# Patient Record
Sex: Female | Born: 1995 | Race: Black or African American | Hispanic: No | Marital: Single | Smoking: Never smoker
Health system: Southern US, Community
[De-identification: ages and names within clinical notes are randomized; demographics above are authoritative.]

## PROBLEM LIST (undated history)

## (undated) DIAGNOSIS — Z789 Other specified health status: Secondary | ICD-10-CM

## (undated) DIAGNOSIS — A749 Chlamydial infection, unspecified: Secondary | ICD-10-CM

## (undated) DIAGNOSIS — N739 Female pelvic inflammatory disease, unspecified: Secondary | ICD-10-CM

## (undated) HISTORY — PX: HAND SURGERY: SHX662

## (undated) HISTORY — PX: NO PAST SURGERIES: SHX2092

---
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
## 2017-01-03 ENCOUNTER — Telehealth: Payer: Self-pay | Admitting: *Deleted

## 2017-01-03 NOTE — Telephone Encounter (Signed)
I called [facility] and phone rang many times but no one answered, nor voicemail- unable to leave a message. Per chart review letter was sent- need to discuss with patient.

## 2017-01-03 NOTE — Telephone Encounter (Signed)
Pt left message on 1/6 stating that she would like her test results.

## 2017-01-04 NOTE — Telephone Encounter (Signed)
Per chart review, patient has chlamydia & we haven't been able to reach her with results. Called patient at number multiple times but no answer- phone keeps ringing. Also tried emergency contact number

## 2017-01-06 ENCOUNTER — Encounter: Payer: Self-pay | Admitting: *Deleted

## 2017-01-06 NOTE — Telephone Encounter (Signed)
Final attempt to reach pt by phone - no answer and no voice mail to leave message. Per chart review, unable to find previously mentioned letter to pt, therefore it was drafted and will be sent today. Letter will also include information regarding +UTI and need for treatment with antibiotic. Communicable Disease Report has been sent to [place] [facility] dept per note from Tresa Endoarole Michalski, RN on 12/29/16.

## 2017-03-10 IMAGING — US US OB TRANSVAGINAL
1 series · 15 of 28 positions shown · non-contrast
Comparison: Ultrasound dated 06/18/2016

ADDENDUM:
Please note with a positive HCG level and in the absence of
documented intrauterine pregnancy, the possibility of an ectopic
pregnancy is not entirely excluded. Correlation with serial HCG
levels and follow-up with ultrasound recommended.
CLINICAL DATA: 19-year-old female with abdominal and pelvic pain

EXAM:
TRANSVAGINAL OB ULTRASOUND
TECHNIQUE: Transvaginal ultrasound was performed for complete evaluation of the
gestation as well as the maternal uterus, adnexal regions, and
pelvic cul-de-sac.

[Series 1: us ob transvaginal · 15 of 30 slices shown]
[im 1/30]
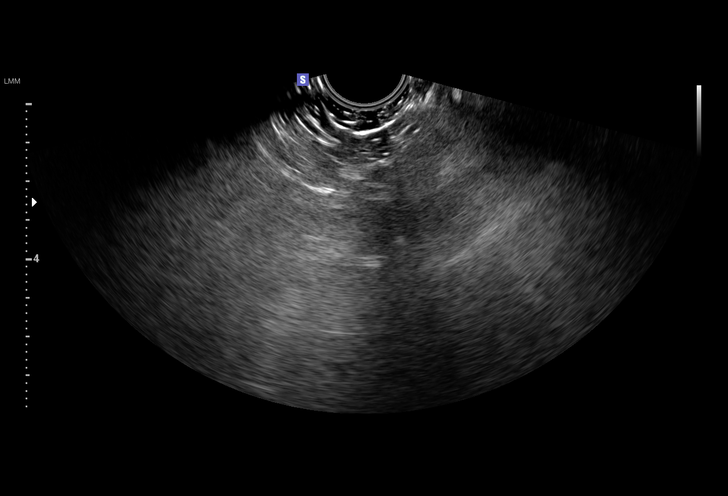
[im 3/30]
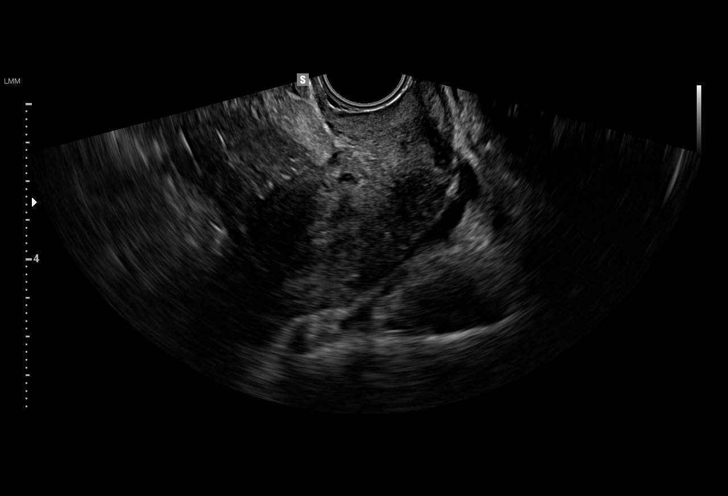
[im 5/30]
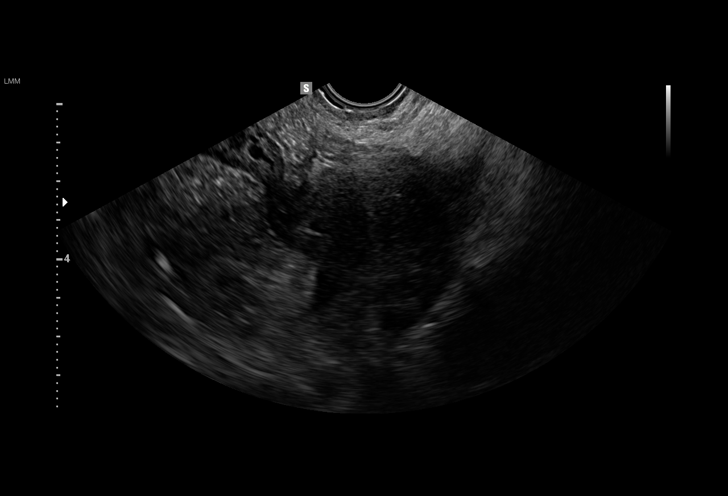
[im 7/30]
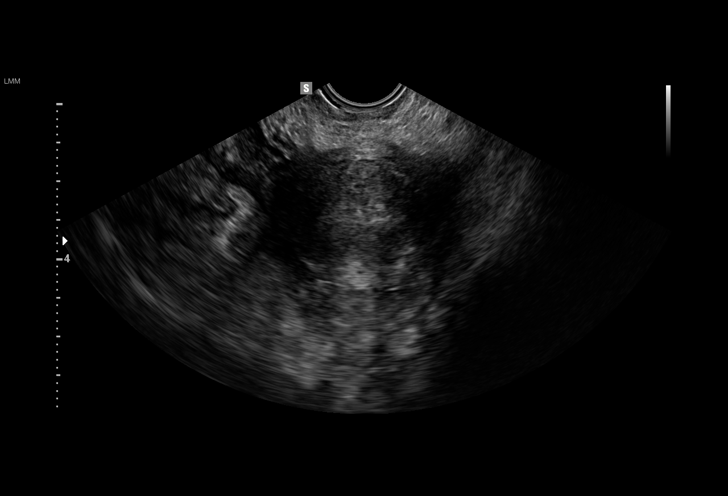
[im 9/30]
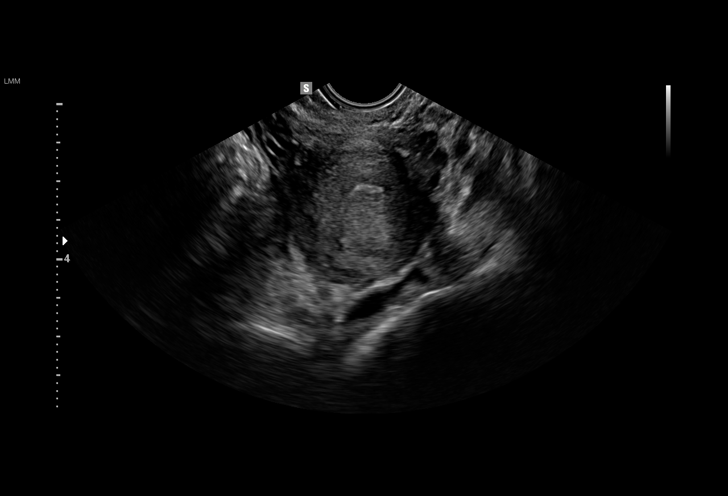
[im 11/30]
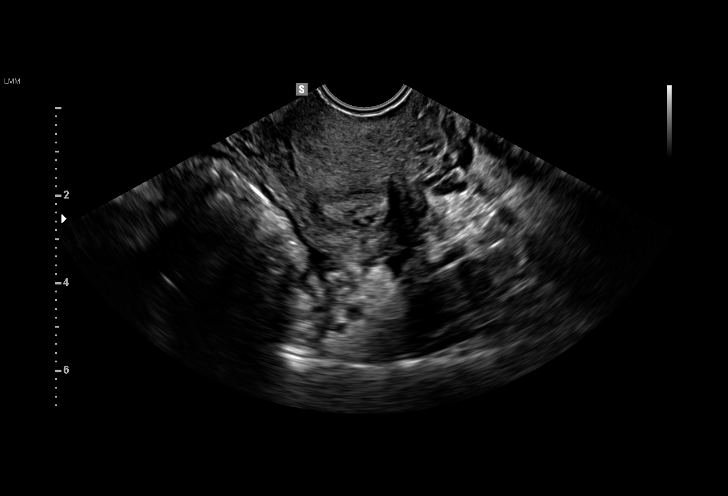
[im 13/30]
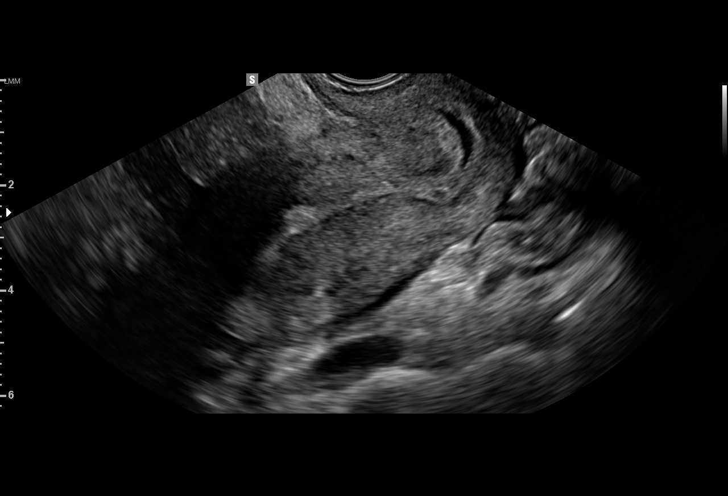
[im 16/30]
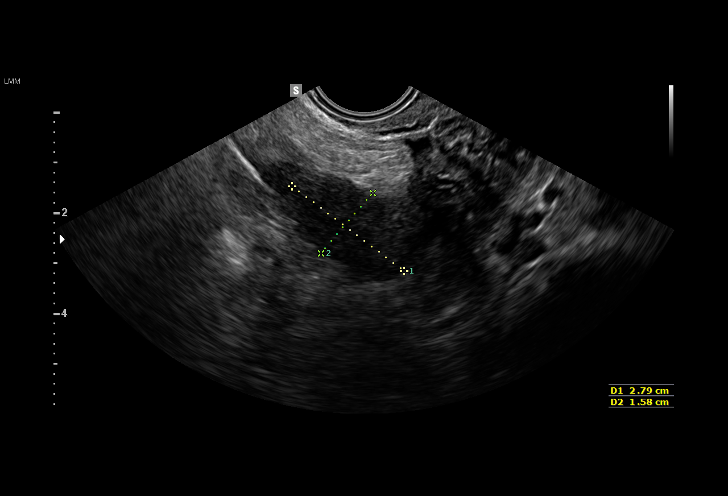
[im 17/30]
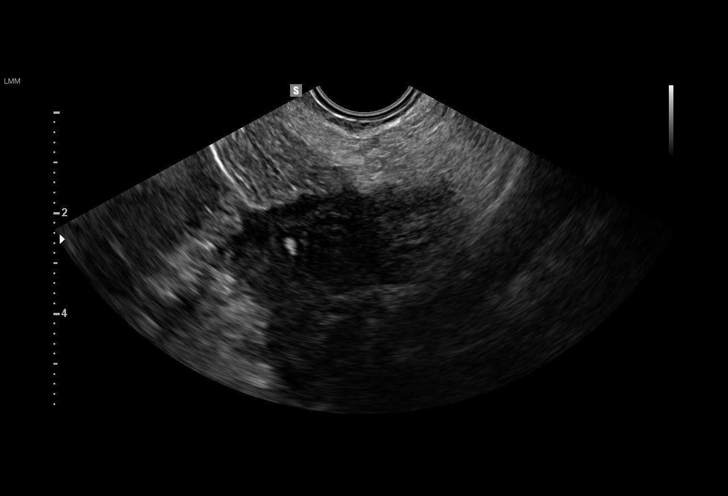
[im 19/30]
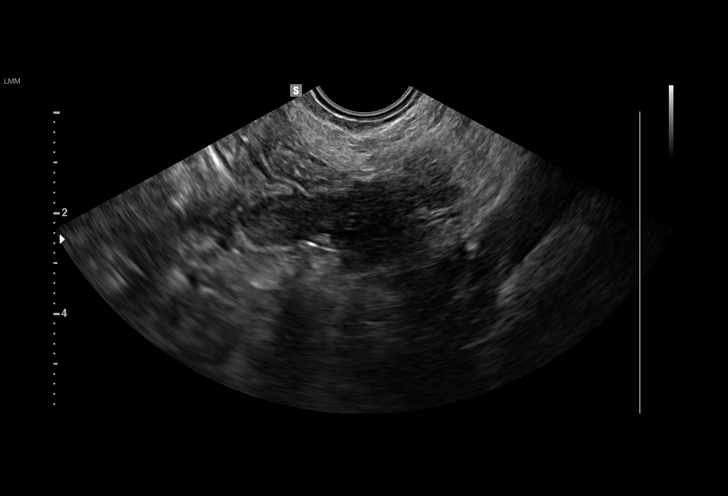
[im 21/30]
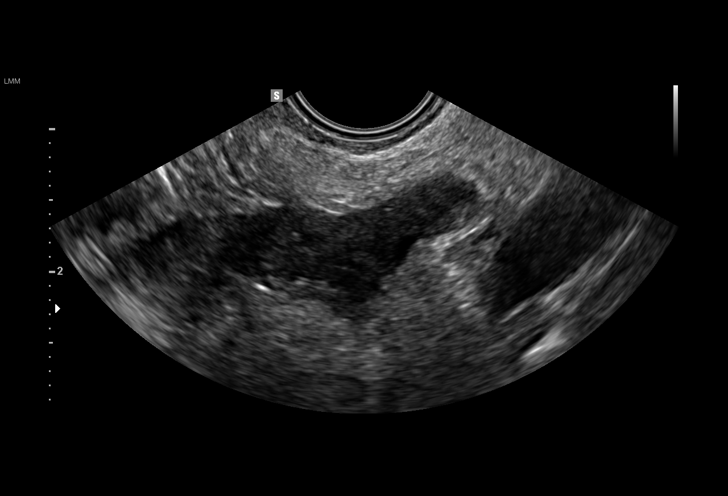
[im 23/30]
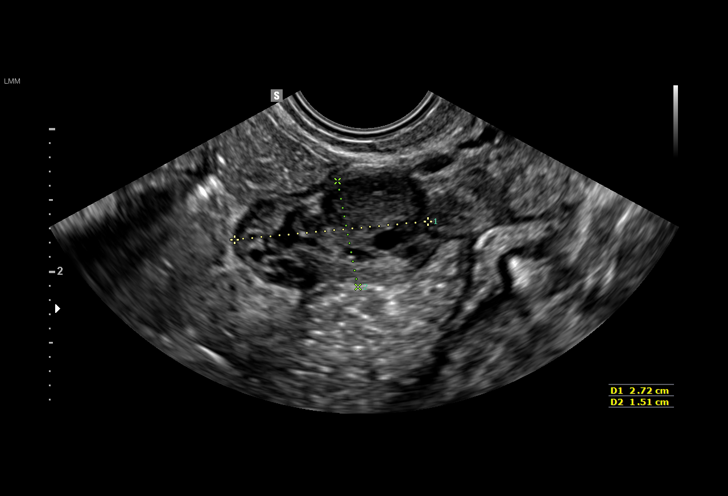
[im 25/30]
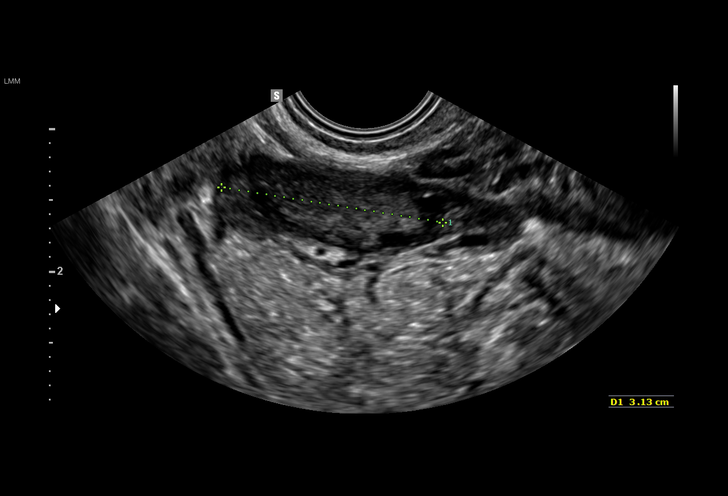
[im 27/30]
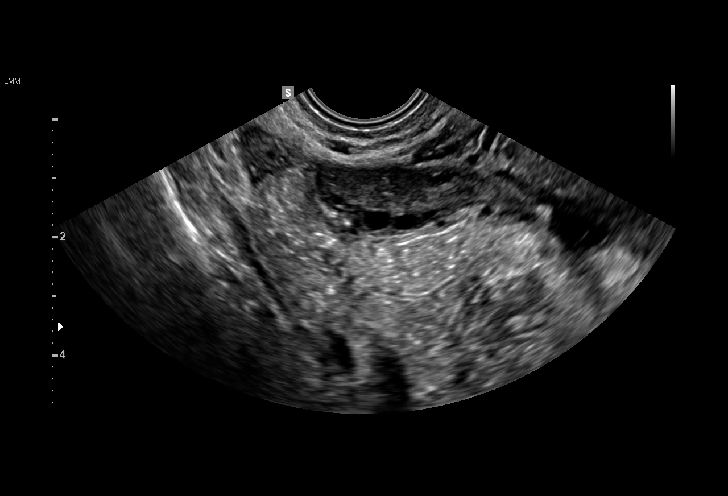
[im 30/30]
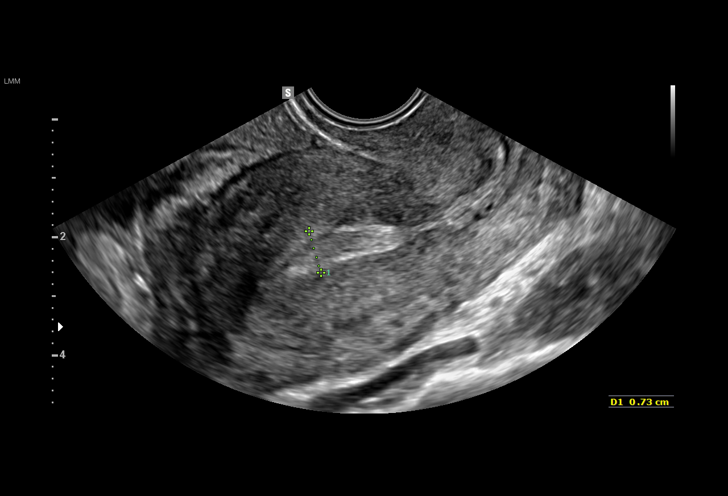

[15 of 28 positions shown; findings below may reference images not displayed]

FINDINGS: The uterus is anteverted and appears unremarkable. The endometrium
measures 7 mm in thickness. No intrauterine pregnancy identified.

The ovaries appear unremarkable. The right ovary measures 2.7 x
x 3.1 cm. There is a probable corpus luteum in the right ovary. The
left ovary measures 2.8 x 3.0 x 1.6 cm.

No free fluid noted within the pelvis.
IMPRESSION: Unremarkable uterus and the ovaries. No intrauterine pregnancy
identified.

## 2017-03-11 IMAGING — CT CT ABD-PELV W/ CM
1 of 2 series · 14 of 32 positions shown, 18 images · IV contrast (OMNIPAQUE)
Comparison: Pelvic ultrasound dated 06/19/2016

ADDENDUM:
These results were called by telephone at the time of interpretation
on 06/20/2016 at [DATE] to Dr. HEGOI RF , who verbally acknowledged
these results.
CLINICAL DATA: 19-year-old female with right lower quadrant
abdominal pain. Active miscarriage.

EXAM:
CT ABDOMEN AND PELVIS WITH CONTRAST
TECHNIQUE: Multidetector CT imaging of the abdomen and pelvis was performed
using the standard protocol following bolus administration of
intravenous contrast.
CONTRAST:  100mL [id number] IOPAMIDOL ([id number]) INJECTION 61%

[Series 2: routine abdomen/pelvis with · axial · 0.59mm/px · z∈[-436,-71]mm · 14 of 84 slices shown, 18 images]
[im 7/84  soft-tissue]
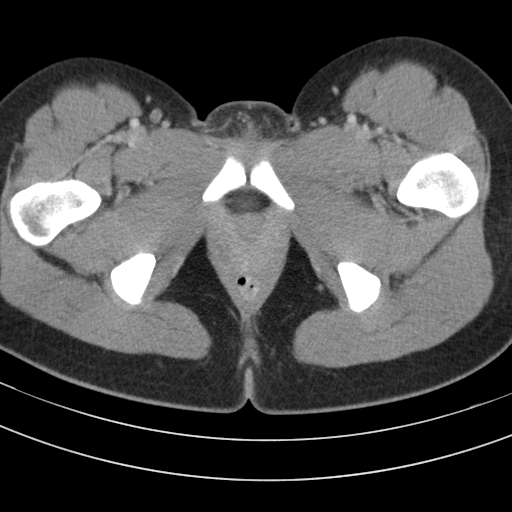
[im 7/84  bone]
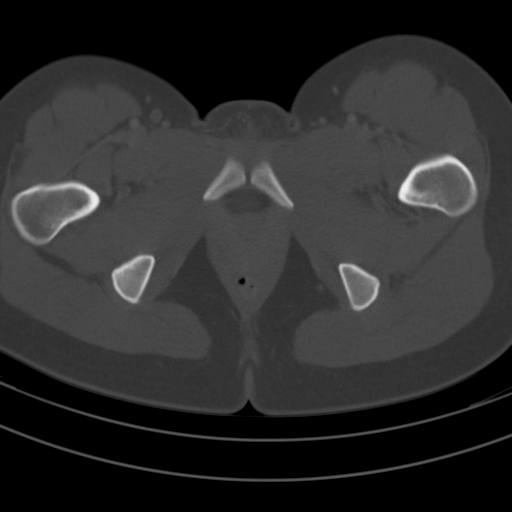
[im 13/84  soft-tissue]
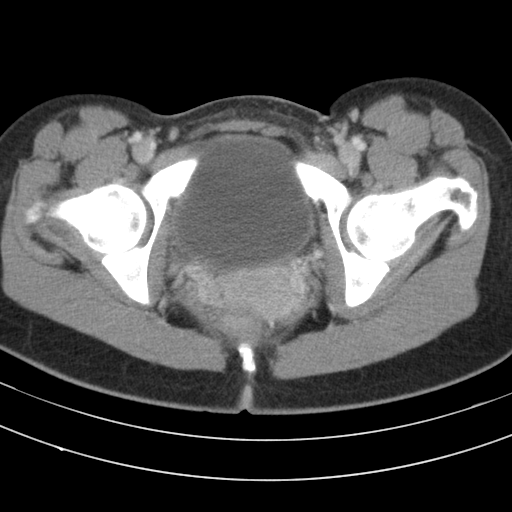
[im 20/84  soft-tissue]
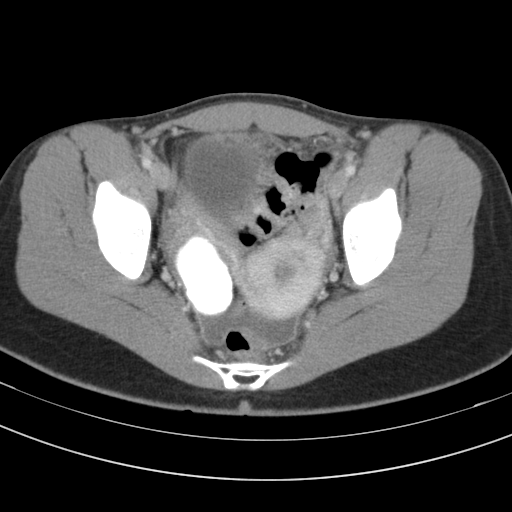
[im 26/84  soft-tissue]
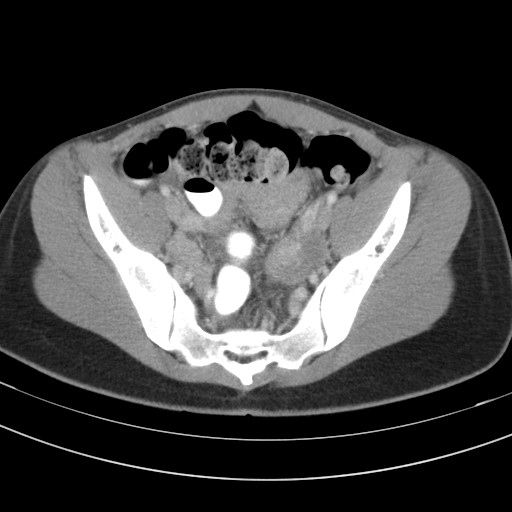
[im 32/84  soft-tissue]
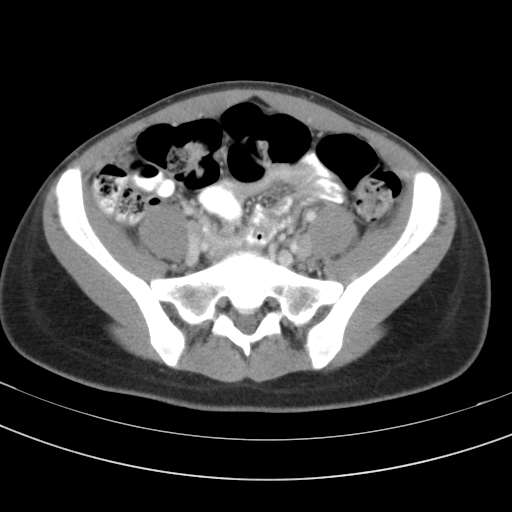
[im 39/84  soft-tissue]
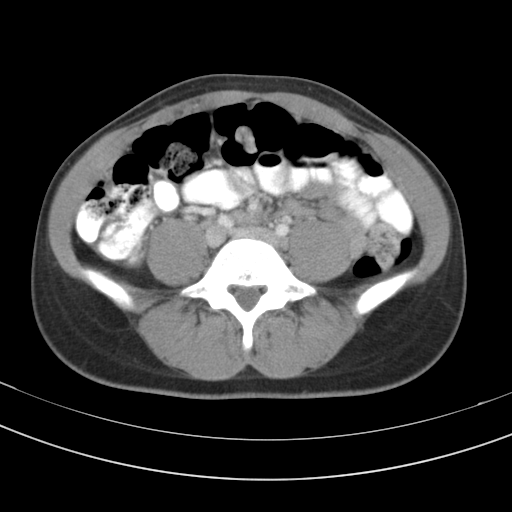
[im 45/84  soft-tissue]
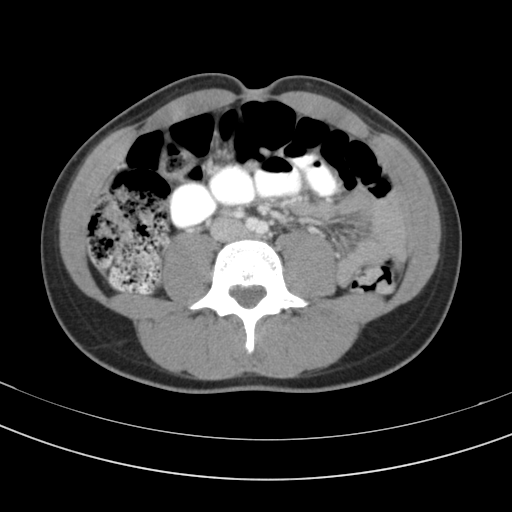
[im 52/84  soft-tissue]
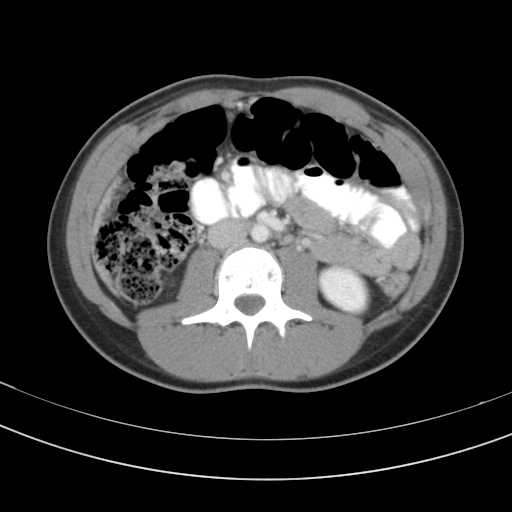
[im 58/84  soft-tissue]
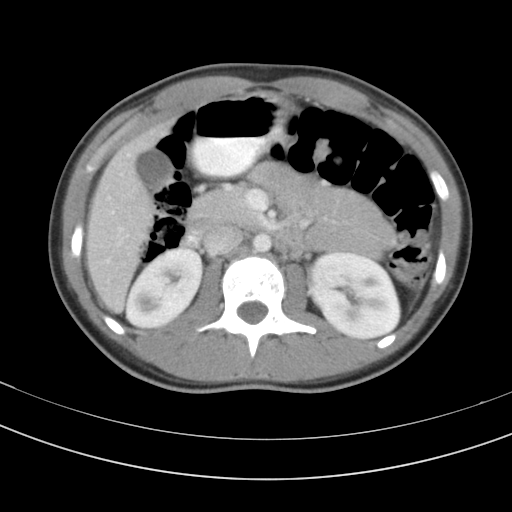
[im 58/84  bone]
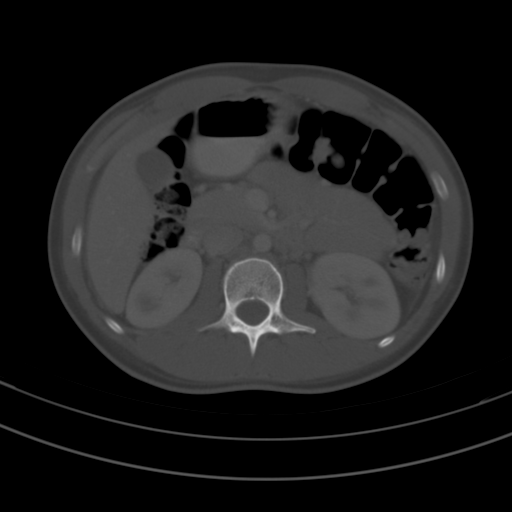
[im 64/84  soft-tissue]
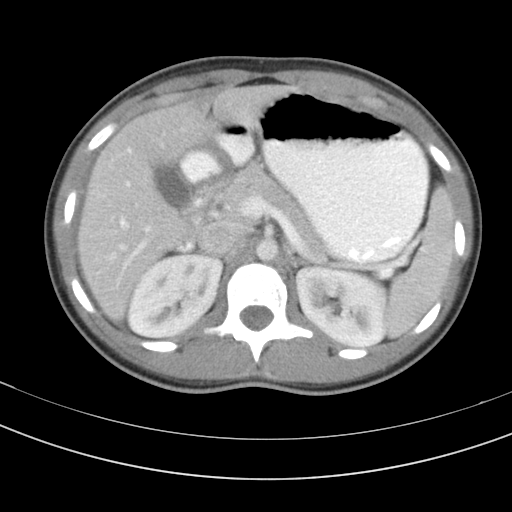
[im 71/84  soft-tissue]
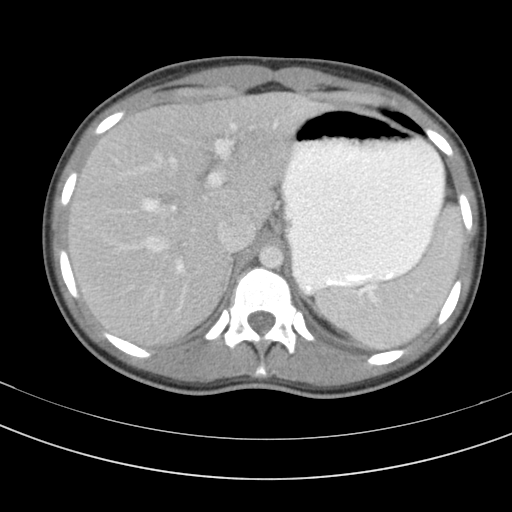
[im 71/84  lung]
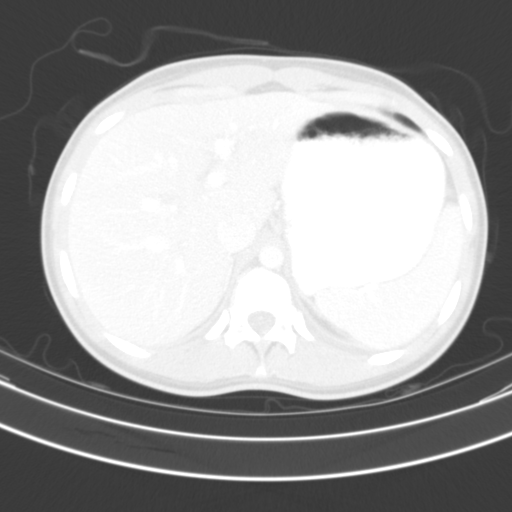
[im 74/84  lung]
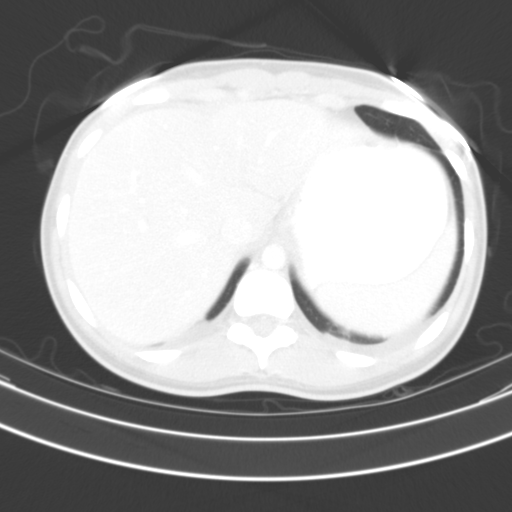
[im 77/84  soft-tissue]
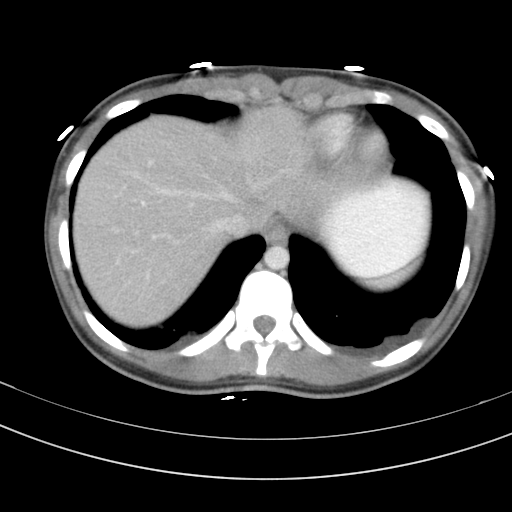
[im 77/84  lung]
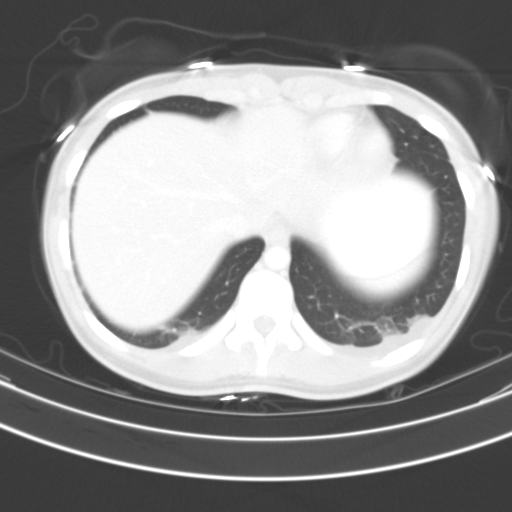
[im 80/84  lung]
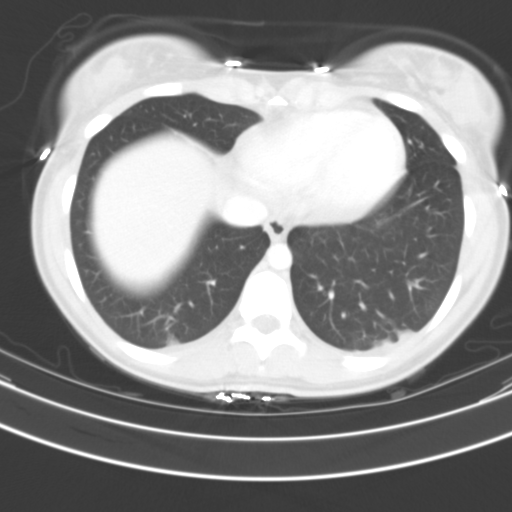

[14 of 32 positions shown; findings below may reference images not displayed]

FINDINGS: Partially visualized small bilateral pleural effusions. The
visualized lung bases are clear. No intra-abdominal free air. There
is a small amount of loculated appearing fluid within the posterior
pelvis extending along the right posterior lateral pelvic wall
concerning for an abscess. This measures approximately 3.3 x 8.7 cm
in greatest axial dimension.

The liver, gallbladder, pancreas, spleen, adrenal glands, kidneys,
visualized ureters, and urinary bladder appear unremarkable. The
uterus and ovaries are grossly unremarkable.

There is thickened appearance of the rectosigmoid with apparent
inflammatory changes of htis segment of bowel. Evaluation however is
limited due to surrounding fluid and non opacification of this
segment of bowel with oral contrast. Multiple tiny pockets of gas
superior to the sigmoid colon (coronal images 73, 76 and sagittal
images [REDACTED] be intraluminal air within a collapsed colonic
fold or represent focally contained microperforations. The thickened
and inflamed appearance of the sigmoid may be reactive to
surrounding infected fluid or represent colitis. Diverticulitis is
less likely given patient's age. Correlation with clinical exam and
history of underlying inflammatory bowel disease recommended. There
is moderate stool throughout the remainder of the colon. There is no
evidence of small-bowel obstruction. Normal appendix.

The abdominal aorta and IVC appear unremarkable. No portal venous
gas identified. There is no adenopathy. The abdominal wall soft
tissues appear unremarkable. The osseous structures are intact.
IMPRESSION: Small loculated fluid in the posterior pelvis with enhancing walls
most compatible with an infected fluid or an abscess. There is
thickened and mildly inflamed appearance of the sigmoid colon which
may be reactive to surrounding infected fluid or represent colitis.
Correlation with clinical exam and history of inflammatory bowel
disease recommended. Multiple small pockets of gas superior to the
sigmoid colon may be intraluminal air within a collapsed colonic
fold or represent focally contained microperforation.

No bowel obstruction.  Normal appendix.

## 2021-09-07 ENCOUNTER — Emergency Department (HOSPITAL_COMMUNITY)
Admission: EM | Admit: 2021-09-07 | Discharge: 2021-09-07 | Disposition: A | Discharge status: Home / Self Care | Payer: Medicaid - Out of State | Attending: Emergency Medicine | Admitting: Emergency Medicine

## 2021-09-07 ENCOUNTER — Encounter (HOSPITAL_COMMUNITY): Payer: Self-pay | Admitting: *Deleted

## 2021-09-07 ENCOUNTER — Other Ambulatory Visit: Payer: Self-pay

## 2021-09-07 DIAGNOSIS — Z5321 Procedure and treatment not carried out due to patient leaving prior to being seen by health care provider: Secondary | ICD-10-CM | POA: Insufficient documentation

## 2021-09-07 DIAGNOSIS — W07XXXA Fall from chair, initial encounter: Secondary | ICD-10-CM | POA: Insufficient documentation

## 2021-09-07 DIAGNOSIS — R0781 Pleurodynia: Secondary | ICD-10-CM | POA: Insufficient documentation

## 2021-09-07 DIAGNOSIS — Z3A00 Weeks of gestation of pregnancy not specified: Secondary | ICD-10-CM | POA: Diagnosis not present

## 2021-09-07 DIAGNOSIS — O26899 Other specified pregnancy related conditions, unspecified trimester: Secondary | ICD-10-CM | POA: Diagnosis not present

## 2021-09-07 LAB — CBC WITH DIFFERENTIAL/PLATELET
Abs Immature Granulocytes: 0.01 10*3/uL (ref 0.00–0.07)
Basophils Absolute: 0 10*3/uL (ref 0.0–0.1)
Basophils Relative: 0 %
Eosinophils Absolute: 0.3 10*3/uL (ref 0.0–0.5)
Eosinophils Relative: 6 %
HCT: 38.5 % (ref 36.0–46.0)
Hemoglobin: 12.8 g/dL (ref 12.0–15.0)
Immature Granulocytes: 0 %
Lymphocytes Relative: 33 %
Lymphs Abs: 1.6 10*3/uL (ref 0.7–4.0)
MCH: 29.4 pg (ref 26.0–34.0)
MCHC: 33.2 g/dL (ref 30.0–36.0)
MCV: 88.3 fL (ref 80.0–100.0)
Monocytes Absolute: 0.2 10*3/uL (ref 0.1–1.0)
Monocytes Relative: 5 %
Neutro Abs: 2.7 10*3/uL (ref 1.7–7.7)
Neutrophils Relative %: 56 %
Platelets: 162 10*3/uL (ref 150–400)
RBC: 4.36 MIL/uL (ref 3.87–5.11)
RDW: 11.9 % (ref 11.5–15.5)
WBC: 4.8 10*3/uL (ref 4.0–10.5)
nRBC: 0 % (ref 0.0–0.2)

## 2021-09-07 LAB — COMPREHENSIVE METABOLIC PANEL
ALT: 10 U/L (ref 0–44)
AST: 16 U/L (ref 15–41)
Albumin: 3.7 g/dL (ref 3.5–5.0)
Alkaline Phosphatase: 58 U/L (ref 38–126)
Anion gap: 9 (ref 5–15)
BUN: 9 mg/dL (ref 6–20)
CO2: 28 mmol/L (ref 22–32)
Calcium: 9.3 mg/dL (ref 8.9–10.3)
Chloride: 101 mmol/L (ref 98–111)
Creatinine, Ser: 0.65 mg/dL (ref 0.44–1.00)
GFR, Estimated: >60 mL/min (ref >60)
Glucose, Bld: 87 mg/dL (ref 70–99)
Potassium: 4 mmol/L (ref 3.5–5.1)
Sodium: 138 mmol/L (ref 135–145)
Total Bilirubin: 0.6 mg/dL (ref 0.3–1.2)
Total Protein: 7.2 g/dL (ref 6.5–8.1)

## 2021-09-07 LAB — I-STAT BETA HCG BLOOD, ED (MC, WL, AP ONLY): I-stat hCG, quantitative: <5 m[IU]/mL (ref <5)

## 2021-09-07 LAB — HCG, QUANTITATIVE, PREGNANCY: hCG, Beta Chain, Quant, S: 1 m[IU]/mL (ref <5)

## 2021-09-07 NOTE — ED Triage Notes (Signed)
Pt reports using a chair to place curtains last night and fell off the chair. Reports being pregnant and having cramping since the fall but no bleeding. Unsure how many weeks she is, had +home preg test.

## 2021-09-07 NOTE — ED Provider Notes (Signed)
Emergency Medicine Provider Triage Evaluation Note  Katelyn [name] , a 25 y.o. female  was evaluated in triage.  Patient states that she was playing something up on a wall while in a chair but when she fell.  She fell on her right side.  Its that she is pregnant but does not know how far along she is.  Her last menstrual period was sometime in June.  She says that she has had some cramping since she fell otherwise no other symptoms.  No vaginal discharge, no vaginal bleeding, no chest pain, no shortness of breath.  Review of Systems  Positive: Generalized abdominal tenderness, right rib cage tenderness Negative: Chest pain, shortness of breath, nausea, vomiting, diarrhea, vaginal bleeding, vaginal discharge  Physical Exam  BP 124/74 (BP Location: Right Arm)   Pulse 71   Temp 98.6 F (37 C) (Oral)   Resp 18   LMP 06/10/2021 (Approximate)   SpO2 100%  Gen:   Awake, no distress   Resp:  Normal effort  MSK:   Moves extremities without difficulty  Other:  Generalized abdominal tenderness to palpation, pulses equal, heart sounds normal, lungs clear to auscultation  Medical Decision Making  Medically screening exam initiated at 12:29 PM.  Appropriate orders placed.  Glady Mulvehill was informed that the remainder of the evaluation will be completed by another provider, this initial triage assessment does not replace that evaluation, and the importance of remaining in the ED until their evaluation is complete.     Claudie Leach, PA-C 09/07/21 1236    Sloan Leiter, DO 09/07/21 1826

## 2021-09-07 NOTE — ED Notes (Signed)
Pt called for vitals. No response.
